# Patient Record
Sex: Female | Born: 1995 | Race: Black or African American | Hispanic: No | Marital: Single | Smoking: Current some day smoker
Health system: Southern US, Community
[De-identification: ages and names within clinical notes are randomized; demographics above are authoritative.]

## PROBLEM LIST (undated history)

## (undated) DIAGNOSIS — J45909 Unspecified asthma, uncomplicated: Secondary | ICD-10-CM

## (undated) HISTORY — PX: CHOLECYSTECTOMY: SHX55

---

## 2015-11-14 ENCOUNTER — Emergency Department (HOSPITAL_BASED_OUTPATIENT_CLINIC_OR_DEPARTMENT_OTHER)
Admission: EM | Admit: 2015-11-14 | Discharge: 2015-11-14 | Disposition: A | Discharge status: Home / Self Care | Payer: Medicaid Other | Attending: Emergency Medicine | Admitting: Emergency Medicine

## 2015-11-14 ENCOUNTER — Encounter (HOSPITAL_BASED_OUTPATIENT_CLINIC_OR_DEPARTMENT_OTHER): Payer: Self-pay | Admitting: *Deleted

## 2015-11-14 ENCOUNTER — Emergency Department (HOSPITAL_BASED_OUTPATIENT_CLINIC_OR_DEPARTMENT_OTHER): Payer: Medicaid Other

## 2015-11-14 DIAGNOSIS — F17200 Nicotine dependence, unspecified, uncomplicated: Secondary | ICD-10-CM | POA: Diagnosis not present

## 2015-11-14 DIAGNOSIS — J45909 Unspecified asthma, uncomplicated: Secondary | ICD-10-CM | POA: Insufficient documentation

## 2015-11-14 DIAGNOSIS — M545 Low back pain, unspecified: Secondary | ICD-10-CM

## 2015-11-14 HISTORY — DX: Unspecified asthma, uncomplicated: J45.909

## 2015-11-14 LAB — URINALYSIS, ROUTINE W REFLEX MICROSCOPIC
BILIRUBIN URINE: NEGATIVE
Glucose, UA: NEGATIVE mg/dL
Hgb urine dipstick: NEGATIVE
Ketones, ur: NEGATIVE mg/dL
Leukocytes, UA: NEGATIVE
NITRITE: NEGATIVE
PROTEIN: NEGATIVE mg/dL
SPECIFIC GRAVITY, URINE: 1.024 (ref 1.005–1.030)
pH: 6.5 (ref 5.0–8.0)

## 2015-11-14 LAB — PREGNANCY, URINE: PREG TEST UR: NEGATIVE

## 2015-11-14 MED ORDER — DIAZEPAM 5 MG PO TABS: 5.0000 mg | ORAL_TABLET | Freq: Two times a day (BID) | ORAL | 0 refills | Status: DC

## 2015-11-14 MED ORDER — IBUPROFEN 600 MG PO TABS: 600.0000 mg | ORAL_TABLET | Freq: Four times a day (QID) | ORAL | 0 refills | Status: DC | PRN

## 2015-11-14 NOTE — Discharge Instructions (Signed)
Take your medication as prescribed as needed for pain relief. I also recommend applying ice or heat to affected area for 15 minutes 3-4 times daily. Please follow up with a primary care provider from the Resource Guide provided below in one week if your symptoms have not improved. Please return to the Emergency Department if symptoms worsen or new onset of fever, numbness, tingling, saddle anesthesia, loss of bowel or bladder, weakness.

## 2015-11-14 NOTE — ED Provider Notes (Signed)
[facility] Provider Note   CSN: [id number] Arrival date & time: 11/14/15  1419  First Provider Contact:  First MD Initiated Contact with Patient 11/14/15 1620     By signing my name below, I, Rosario Adie, attest that this documentation has been prepared under the direction and in the presence of Melburn Hake, PA-C.  Electronically Signed: Rosario Adie, ED Scribe. 11/14/15. 4:35 PM.  History   Chief Complaint Chief Complaint  Patient presents with  . Back Pain   The history is provided by the patient. No language interpreter was used.   HPI Comments: Makayla Lewis is a 20 y.o. female who presents to the Emergency Department complaining of gradual onset, intermittent, throbbing lower middle and left-sided back pain that radiates into her left hip and thigh onset ~5 days ago, worsening this AM. She states that she has had associated tingling down due to left buttocks since the onset of her back pain. Pt reports that she was trying to help lift her grandmother up five days ago when she lost her balance causing her to fall onto a wooden table and strike the right side of her body. Denies LOC or head injury. She states that her pain initially was only mild after the incident, but worsened when she woke up this morning. 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 Pt reports that she has taken Tramadol, Naproxen, and Aleve with minimal relief of her pain. Her pain is worsened with bending, twisting, and ambulation. No hx of cancer, of IVDA. She has not seen a Chiropractor or Acupuncturist since the onset of her back pain, or otherwise. Denies nausea, vomiting, abdominal pain, diarrhea, constipation, bowel/bladder incontinence, vaginal bleeding, saddle paresthesias, weakness, or any other associated symptoms.  Past Medical History:  Diagnosis Date  . Asthma     There are no active problems to display for this patient.  Past Surgical History:  Procedure Laterality Date  . CHOLECYSTECTOMY     OB History    No data available     Home Medications    Prior to Admission medications   Medication Sig Start Date End Date Taking? Authorizing Provider  diazepam (VALIUM) 5 MG tablet Take 1 tablet (5 mg total) by mouth 2 (two) times daily. 11/14/15   Barrett Henle, PA-C  ibuprofen (ADVIL,MOTRIN) 600 MG tablet Take 1 tablet (600 mg total) by mouth every 6 (six) hours as needed. 11/14/15   Barrett Henle, PA-C   Family History History reviewed. No pertinent family history.  Social History Social History  Substance Use Topics  . Smoking status: Current Some Day Smoker  . Smokeless tobacco: Never Used  . Alcohol use No   Allergies   Review of patient's allergies indicates no known allergies.  Review of Systems Review of Systems  Gastrointestinal: Negative for abdominal pain, constipation, diarrhea, nausea and vomiting.  Genitourinary: Negative for vaginal bleeding.  Musculoskeletal: Positive for back pain (lower, left sided) and myalgias.  Neurological: Negative for syncope, weakness and numbness.       Positive for tingling (left leg).  All other systems reviewed and are negative.  Physical Exam Updated Vital Signs BP (!) 105/52 (BP Location: Right Arm)   Pulse 89   Temp 98.3 F (36.8 C) (Oral)   Resp 18   Ht 5\' 5"  (1.651 m)   Wt 96.6 kg   LMP  10/24/2015 (Approximate)   SpO2 99%   BMI 35.45 kg/m   Physical Exam  Constitutional: She is oriented to person, place, and time. She appears well-developed and well-nourished.  HENT:  Head: Normocephalic and atraumatic.  Eyes: Conjunctivae and EOM are normal. Right eye exhibits no discharge. Left eye exhibits no discharge. No scleral icterus.  Neck: Normal range of motion. Neck supple.  Cardiovascular: Normal rate, regular rhythm, normal heart sounds and intact distal  pulses.   No murmur heard. Pulmonary/Chest: Effort normal and breath sounds normal. No respiratory distress. She has no wheezes. She has no rales. She exhibits no tenderness.  Abdominal: Soft. Bowel sounds are normal. She exhibits no distension and no mass. There is no tenderness. There is no rebound and no guarding. No hernia.  Musculoskeletal: Normal range of motion. She exhibits no edema.  No midline C, T, or L tenderness. TTP over left lower thoracic and lumbar paraspinal muscles.  Full range of motion of neck and back. Full range of motion of bilateral upper and lower extremities, with 5/5 strength. Sensation intact. 2+ radial and PT pulses. Cap refill <2 seconds. Patient able to stand and ambulate without assistance. Negative straight leg raise bilaterally.   Neurological: She is alert and oriented to person, place, and time. She has normal strength. No sensory deficit. Gait normal.  Skin: Skin is warm and dry.  Psychiatric: She has a normal mood and affect. Her behavior is normal.  Nursing note and vitals reviewed.  ED Treatments / Results  DIAGNOSTIC STUDIES: Oxygen Saturation is 99% on RA, normal by my interpretation.   COORDINATION OF CARE: 4:35 PM-Discussed next steps with pt. Pt verbalized understanding and is agreeable with the plan.   Labs (all labs ordered are listed, but only abnormal results are displayed) Labs Reviewed  URINALYSIS, ROUTINE W REFLEX MICROSCOPIC (NOT AT [facility]) - Abnormal; Notable for the following:       Result Value   APPearance CLOUDY (*)    All other components within normal limits  PREGNANCY, URINE    EKG  EKG Interpretation None       Radiology Dg Lumbar Spine Complete  Result Date: 11/14/2015 CLINICAL DATA:  Fall onto coffee table five days ago. Low back pain radiating to left leg. Initial encounter. EXAM: LUMBAR SPINE - COMPLETE 4+ VIEW COMPARISON:  None. FINDINGS: There is no evidence of lumbar spine fracture. Alignment is normal.  Intervertebral disc spaces are maintained. No evidence of facet arthropathy or other bone lesions. Right upper quadrant surgical clips, consistent with prior cholecystectomy. IMPRESSION: Negative lumbar spine radiographs. Electronically Signed   By: Myles Rosenthal M.D.   On: 11/14/2015 16:08    Procedures Procedures (including critical care time)  Medications Ordered in ED Medications - No data to display   Initial Impression / Assessment and Plan / ED Course  I have reviewed the triage vital signs and the nursing notes.  Pertinent labs & imaging results that were available during my care of the patient were reviewed by me and considered in my medical decision making (see chart for details).  Clinical Course  Comment By Time  Pregnancy negative ordered lumbar XR while Pt is in waiting to expedite care Wynetta Emery, PA-C 08/12 1546    Patient presents with left-sided mid and lower back pain that started 1 week ago after she fell while trying to treat her grandmother. Denies head injury or LOC. Pain is worse with movement, bending or lifting. No back pain red flags. VSS. Exam  revealed tenderness over left lower thoracic and lumbar paraspinal muscles, no midline spinal tenderness, no neuro deficits. Remaining exam unremarkable. Lumbar spine x-ray negative. Pregnancy negative. UA unremarkable. I suspect patient's symptoms are likely due to muscle strain associated with recent injury. Plan to discharge patient home with NSAIDs, muscle relaxant and symptomatic treatment. Patient given information to follow up with PCP as needed. Discussed return precautions with patient.  Final Clinical Impressions(s) / ED Diagnoses   Final diagnoses:  Left-sided low back pain without sciatica    New Prescriptions New Prescriptions   DIAZEPAM (VALIUM) 5 MG TABLET    Take 1 tablet (5 mg total) by mouth 2 (two) times daily.   IBUPROFEN (ADVIL,MOTRIN) 600 MG TABLET    Take 1 tablet (600 mg total) by mouth  every 6 (six) hours as needed.   I personally performed the services described in this documentation, which was scribed in my presence. The recorded information has been reviewed and is accurate.     Satira Sarkicole Elizabeth TexhomaNadeau, [place] JerseyPA-C 11/14/15 1647    Rolland PorterMark James, MD 11/17/15 1137

## 2015-11-14 NOTE — ED Triage Notes (Signed)
Pt states she fell approx 1 week ago and has had pain in her lower back since. Pain radiates to left side. Has tried Naproxen and Tramadol without relief. Denies other s/s.

## 2015-11-14 NOTE — ED Notes (Signed)
Pt given d/c instructions as per chart. Rx x 2. Verbalizes understanding. No questions. 

## 2017-08-11 ENCOUNTER — Encounter (HOSPITAL_BASED_OUTPATIENT_CLINIC_OR_DEPARTMENT_OTHER): Payer: Self-pay | Admitting: Emergency Medicine

## 2017-08-11 ENCOUNTER — Other Ambulatory Visit: Payer: Self-pay

## 2017-08-11 ENCOUNTER — Emergency Department (HOSPITAL_BASED_OUTPATIENT_CLINIC_OR_DEPARTMENT_OTHER)
Admission: EM | Admit: 2017-08-11 | Discharge: 2017-08-11 | Disposition: A | Discharge status: Home / Self Care | Payer: Self-pay | Attending: Emergency Medicine | Admitting: Emergency Medicine

## 2017-08-11 DIAGNOSIS — R51 Headache: Secondary | ICD-10-CM | POA: Insufficient documentation

## 2017-08-11 DIAGNOSIS — F17200 Nicotine dependence, unspecified, uncomplicated: Secondary | ICD-10-CM | POA: Insufficient documentation

## 2017-08-11 DIAGNOSIS — J45909 Unspecified asthma, uncomplicated: Secondary | ICD-10-CM | POA: Insufficient documentation

## 2017-08-11 DIAGNOSIS — R1110 Vomiting, unspecified: Secondary | ICD-10-CM | POA: Insufficient documentation

## 2017-08-11 DIAGNOSIS — H53149 Visual discomfort, unspecified: Secondary | ICD-10-CM | POA: Insufficient documentation

## 2017-08-11 DIAGNOSIS — R519 Headache, unspecified: Secondary | ICD-10-CM

## 2017-08-11 LAB — PREGNANCY, URINE: Preg Test, Ur: NEGATIVE

## 2017-08-11 MED ORDER — DIPHENHYDRAMINE HCL 50 MG/ML IJ SOLN
25.0000 mg | Freq: Once | INTRAMUSCULAR | Status: AC
  Administered 2017-08-11: 25 mg via INTRAVENOUS
  Filled 2017-08-11: qty 1

## 2017-08-11 MED ORDER — SODIUM CHLORIDE 0.9 % IV BOLUS
500.0000 mL | Freq: Once | INTRAVENOUS | Status: AC
  Administered 2017-08-11: 500 mL via INTRAVENOUS

## 2017-08-11 MED ORDER — METOCLOPRAMIDE HCL 5 MG/ML IJ SOLN
10.0000 mg | Freq: Once | INTRAMUSCULAR | Status: AC
  Administered 2017-08-11: 10 mg via INTRAVENOUS
  Filled 2017-08-11: qty 2

## 2017-08-11 NOTE — ED Notes (Signed)
Pt understood dc material. NAD noted. 

## 2017-08-11 NOTE — ED Provider Notes (Signed)
[facility] EMERGENCY DEPARTMENT Provider Note   CSN: [id number] Arrival date & time: 08/11/17  2048     History   Chief Complaint Chief Complaint  Patient presents with  . Headache    HPI Makayla Lewis is a 22 y.o. female.  Patient presents with complaint of headache over the past 3 days with episodes of vomiting.  She has associated light sensitivity.  Patient states that the pain is in the back of her head and moves over the top of her head.  Symptoms started gradually and were on and off however symptoms have become constant and have worsened gradually.  Patient denies any fevers or confusion.  She has no pain with movement of her neck.  No head injuries reported.  No sinus infection symptoms or toothaches. No vision loss or vision changes.  She is able to walk without any difficulty.   She denies any chest pain or abdominal pain.  She has not had any previous diagnosis of migraines or chronic headache.     Past Medical History:  Diagnosis Date  . Asthma     There are no active problems to display for this patient.   Past Surgical History:  Procedure Laterality Date  . CHOLECYSTECTOMY       OB History   None      Home Medications    Prior to Admission medications   Medication Sig Start Date End Date Taking? Authorizing Provider  albuterol (PROVENTIL HFA;VENTOLIN HFA) 108 (90 Base) MCG/ACT inhaler Inhale 2 puffs into the lungs every 6 (six) hours as needed for wheezing or shortness of breath.   Yes [provider]    Family History No family history on file.  Social History Social History   Tobacco Use  . Smoking status: Current Some Day Smoker  . Smokeless tobacco: Never Used  Substance Use Topics  . Alcohol use: No  . Drug use: No     Allergies   Patient has no known allergies.   Review of Systems Review of Systems  Constitutional: Negative for fever.  HENT: Negative for congestion, dental problem, rhinorrhea and sinus  pressure.   Eyes: Positive for photophobia. Negative for discharge, redness and visual disturbance.  Respiratory: Negative for shortness of breath.   Cardiovascular: Negative for chest pain.  Gastrointestinal: Positive for nausea and vomiting.  Musculoskeletal: Negative for gait problem, neck pain and neck stiffness.  Skin: Negative for rash.  Neurological: Positive for headaches. Negative for syncope, speech difficulty, weakness, light-headedness and numbness.  Psychiatric/Behavioral: Negative for confusion.     Physical Exam Updated Vital Signs BP 125/89 (BP Location: Left Arm)   Pulse 81   Temp 98.4 F (36.9 C) (Oral)   Resp 18   Ht  (1.651 m)   Wt 96.2 kg (212 lb)   SpO2 100%   BMI 35.28 kg/m   Physical Exam  Constitutional: She is oriented to person, place, and time. She appears well-developed and well-nourished.  HENT:  Head: Normocephalic and atraumatic.  Right Ear: Tympanic membrane, external ear and ear canal normal.  Left Ear: Tympanic membrane, external ear and ear canal normal.  Nose: Nose normal.  Mouth/Throat: Uvula is midline, oropharynx is clear and moist and mucous membranes are normal.  No sinus pressure or grossly abnormal dentition.  Eyes: Pupils are equal, round, and reactive to light. Conjunctivae, EOM and lids are normal. Right eye exhibits no nystagmus. Left eye exhibits no nystagmus.  Neck: Normal range of motion. Neck supple.  No signs of meningismus.  Cardiovascular: Normal rate and regular rhythm.  Pulmonary/Chest: Effort normal and breath sounds normal.  Abdominal: Soft. There is no tenderness.  Musculoskeletal:       Cervical back: She exhibits normal range of motion, no tenderness and no bony tenderness.  Neurological: She is alert and oriented to person, place, and time. She has normal strength and normal reflexes. No cranial nerve deficit or sensory deficit. She displays a negative Romberg sign. Coordination and gait normal. GCS eye  subscore is 4. GCS verbal subscore is 5. GCS motor subscore is 6.  Skin: Skin is warm and dry.  Psychiatric: She has a normal mood and affect.  Nursing note and vitals reviewed.    ED Treatments / Results  Labs (all labs ordered are listed, but only abnormal results are displayed) Labs Reviewed  PREGNANCY, URINE    EKG None  Radiology No results found.  Procedures Procedures (including critical care time)  Medications Ordered in ED Medications  metoCLOPramide (REGLAN) injection 10 mg (10 mg Intravenous Given 08/11/17 2121)  diphenhydrAMINE (BENADRYL) injection 25 mg (25 mg Intravenous Given 08/11/17 2121)  sodium chloride 0.9 % bolus 500 mL (0 mLs Intravenous Stopped 08/11/17 2205)     Initial Impression / Assessment and Plan / ED Course  I have reviewed the triage vital signs and the nursing notes.  Pertinent labs & imaging results that were available during my care of the patient were reviewed by me and considered in my medical decision making (see chart for details).     Patient seen and examined.  Patient with normal neurological exam.  No red flag symptoms.  No signs of meningitis.  No reported head injury.  No anticoagulation.  No indications for head CT at this time.  Will reassess after migraine cocktail.  Vital signs reviewed and are as follows: BP 125/89 (BP Location: Left Arm)   Pulse 81   Temp 98.4 F (36.9 C) (Oral)   Resp 18   Ht  (1.651 m)   Wt 96.2 kg (212 lb)   SpO2 100%   BMI 35.28 kg/m   11:00 PM resolution of headache with migraine cocktail.  Feel comfortable discharged home at this time.  Encourage patient to rest tonight, hydrate over the weekend.  Patient counseled to return if they have weakness in their arms or legs, slurred speech, trouble walking or talking, confusion, trouble with their balance, or if they have any other concerns. Patient verbalizes understanding and agrees with plan.     Final Clinical Impressions(s) / ED  Diagnoses   Final diagnoses:  Acute nonintractable headache, unspecified headache type   Patient without high-risk features of headache including: sudden onset/thunderclap HA, altered mental status, accompanying seizure, headache with exertion, age > 19, history of immunocompromise, neck or shoulder pain, fever, use of anticoagulation, family history of spontaneous SAH, concomitant drug use, toxic exposure.   Patient has a normal complete neurological exam, normal vital signs, normal level of consciousness, no signs of meningismus, is well-appearing/non-toxic appearing, no signs of trauma.   Imaging with CT/MRI not indicated given history and physical exam findings.   No dangerous or life-threatening conditions suspected or identified by history, physical exam, and by work-up. No indications for hospitalization identified.    ED Discharge Orders    None       Renne Crigler, PA-C 08/11/17 2300    Melene Plan, DO 08/11/17 2327

## 2017-08-11 NOTE — ED Triage Notes (Signed)
Pt c/o headache x 3 days with vomiting.

## 2017-08-11 NOTE — ED Notes (Signed)
Pt ambulated to restroom without assistance. NAD noted. Steady gait  

## 2017-08-11 NOTE — ED Notes (Signed)
ED Provider at bedside. 

## 2017-08-11 NOTE — ED Notes (Signed)
Pt was given saltine crackers and ginger ale for PO challenge

## 2017-08-11 NOTE — Discharge Instructions (Signed)
Please read and follow all provided instructions.  Your diagnoses today include:  1. Acute nonintractable headache, unspecified headache type     Tests performed today include:  Vital signs. See below for your results today.   Medications:  In the Emergency Department you received:  Reglan - antinausea/headache medication  Benadryl - antihistamine to counteract potential side effects of reglan  Take any prescribed medications only as directed.  Additional information:  Follow any educational materials contained in this packet.  You are having a headache. No specific cause was found today for your headache. It may have been a migraine or other cause of headache. Stress, anxiety, fatigue, and depression are common triggers for headaches.   Your headache today does not appear to be life-threatening or require hospitalization, but often the exact cause of headaches is not determined in the emergency department. Therefore, follow-up with your doctor is very important to find out what may have caused your headache and whether or not you need any further diagnostic testing or treatment.   Sometimes headaches can appear benign (not harmful), but then more serious symptoms can develop which should prompt an immediate re-evaluation by your doctor or the emergency department.  BE VERY CAREFUL not to take multiple medicines containing Tylenol (also called acetaminophen). Doing so can lead to an overdose which can damage your liver and cause liver failure and possibly death.   Follow-up instructions: Please follow-up with your primary care provider in the next 3 days for further evaluation of your symptoms.   Return instructions:   Please return to the Emergency Department if you experience worsening symptoms.  Return if the medications do not resolve your headache, if it recurs, or if you have multiple episodes of vomiting or cannot keep down fluids.  Return if you have a change from the  usual headache.  RETURN IMMEDIATELY IF you:  Develop a sudden, severe headache  Develop confusion or become poorly responsive or faint  Develop a fever above 100.56F or problem breathing  Have a change in speech, vision, swallowing, or understanding  Develop new weakness, numbness, tingling, incoordination in your arms or legs  Have a seizure  Please return if you have any other emergent concerns.  Additional Information:  Your vital signs today were: BP 125/89 (BP Location: Left Arm)    Pulse 81    Temp 98.4 F (36.9 C) (Oral)    Resp 18    Ht  (1.651 m)    Wt 96.2 kg (212 lb)    SpO2 100%    BMI 35.28 kg/m  If your blood pressure (BP) was elevated above 135/85 this visit, please have this repeated by your doctor within one month. --------------

## 2019-05-07 ENCOUNTER — Other Ambulatory Visit: Payer: Self-pay

## 2019-05-07 ENCOUNTER — Encounter (HOSPITAL_BASED_OUTPATIENT_CLINIC_OR_DEPARTMENT_OTHER): Payer: Self-pay

## 2019-05-07 ENCOUNTER — Emergency Department (HOSPITAL_BASED_OUTPATIENT_CLINIC_OR_DEPARTMENT_OTHER)
Admission: EM | Admit: 2019-05-07 | Discharge: 2019-05-07 | Disposition: A | Discharge status: Home / Self Care | Payer: Self-pay | Attending: Emergency Medicine | Admitting: Emergency Medicine

## 2019-05-07 DIAGNOSIS — R6883 Chills (without fever): Secondary | ICD-10-CM | POA: Insufficient documentation

## 2019-05-07 DIAGNOSIS — Z20822 Contact with and (suspected) exposure to covid-19: Secondary | ICD-10-CM | POA: Insufficient documentation

## 2019-05-07 DIAGNOSIS — E876 Hypokalemia: Secondary | ICD-10-CM | POA: Insufficient documentation

## 2019-05-07 DIAGNOSIS — M7918 Myalgia, other site: Secondary | ICD-10-CM | POA: Insufficient documentation

## 2019-05-07 DIAGNOSIS — R519 Headache, unspecified: Secondary | ICD-10-CM | POA: Insufficient documentation

## 2019-05-07 DIAGNOSIS — N61 Mastitis without abscess: Secondary | ICD-10-CM | POA: Insufficient documentation

## 2019-05-07 LAB — BASIC METABOLIC PANEL
Anion gap: 7 (ref 5–15)
BUN: 7 mg/dL (ref 6–20)
CO2: 24 mmol/L (ref 22–32)
Calcium: 8.9 mg/dL (ref 8.9–10.3)
Chloride: 102 mmol/L (ref 98–111)
Creatinine, Ser: 0.79 mg/dL (ref 0.44–1.00)
GFR calc Af Amer: >60 mL/min (ref >60)
GFR calc non Af Amer: >60 mL/min (ref >60)
Glucose, Bld: 82 mg/dL (ref 70–99)
Potassium: 3.2 mmol/L — ABNORMAL LOW (ref 3.5–5.1)
Sodium: 133 mmol/L — ABNORMAL LOW (ref 135–145)

## 2019-05-07 LAB — CBC WITH DIFFERENTIAL/PLATELET
Abs Immature Granulocytes: 0.06 10*3/uL (ref 0.00–0.07)
Basophils Absolute: 0.1 10*3/uL (ref 0.0–0.1)
Basophils Relative: 0 %
Eosinophils Absolute: 0 10*3/uL (ref 0.0–0.5)
Eosinophils Relative: 0 %
HCT: 42.6 % (ref 36.0–46.0)
Hemoglobin: 14 g/dL (ref 12.0–15.0)
Immature Granulocytes: 1 %
Lymphocytes Relative: 22 %
Lymphs Abs: 2.9 10*3/uL (ref 0.7–4.0)
MCH: 31.2 pg (ref 26.0–34.0)
MCHC: 32.9 g/dL (ref 30.0–36.0)
MCV: 94.9 fL (ref 80.0–100.0)
Monocytes Absolute: 1 10*3/uL (ref 0.1–1.0)
Monocytes Relative: 8 %
Neutro Abs: 8.9 10*3/uL — ABNORMAL HIGH (ref 1.7–7.7)
Neutrophils Relative %: 69 %
Platelets: 403 10*3/uL — ABNORMAL HIGH (ref 150–400)
RBC: 4.49 MIL/uL (ref 3.87–5.11)
RDW: 12.2 % (ref 11.5–15.5)
WBC: 13 10*3/uL — ABNORMAL HIGH (ref 4.0–10.5)
nRBC: 0 % (ref 0.0–0.2)

## 2019-05-07 MED ORDER — POTASSIUM CHLORIDE CRYS ER 20 MEQ PO TBCR
40.0000 meq | EXTENDED_RELEASE_TABLET | Freq: Once | ORAL | Status: AC
  Administered 2019-05-07: 19:00:00 40 meq via ORAL
  Filled 2019-05-07: qty 2

## 2019-05-07 MED ORDER — ACETAMINOPHEN 325 MG PO TABS
650.0000 mg | ORAL_TABLET | Freq: Once | ORAL | Status: AC
  Administered 2019-05-07: 18:00:00 650 mg via ORAL
  Filled 2019-05-07: qty 2

## 2019-05-07 MED ORDER — CEPHALEXIN 500 MG PO CAPS: 500.0000 mg | ORAL_CAPSULE | Freq: Four times a day (QID) | ORAL | 0 refills | Status: AC

## 2019-05-07 MED ORDER — POTASSIUM CHLORIDE ER 10 MEQ PO TBCR: 10.0000 meq | EXTENDED_RELEASE_TABLET | Freq: Every day | ORAL | 0 refills | Status: AC

## 2019-05-07 MED ORDER — CEPHALEXIN 250 MG PO CAPS
500.0000 mg | ORAL_CAPSULE | Freq: Once | ORAL | Status: AC
  Administered 2019-05-07: 500 mg via ORAL
  Filled 2019-05-07: qty 2

## 2019-05-07 NOTE — Discharge Instructions (Addendum)
Please pick up antibiotics and take as prescribed for your skin infection. You should see improvement in your symptoms after 48 hours. If no improvement or worsening of symptoms please return to the ED for further evaluation. Otherwise [facility].   Take Ibuprofen and Tylenol as needed for pain. You can also apply and ice pack to your breast to reduce discomfort.   Your potassium was mildly decreased today - we have given you some potassium in the ED and I have discharged you home with some. Please have your potassium level rechecked in 1-2 weeks.   We have also swabbed you for COVID today - please stay home and self isolate until you receive your results. We will call you if you are positive. You can also log into your my chart to check your results. If positive you will need to stay home for 14 days starting today (cleared: 05/22/2019).

## 2019-05-07 NOTE — ED Triage Notes (Addendum)
Pt c/o pain, swelling to left breast, HA, fatigue-pt has redness noted to left breast-nipple piercing in place-states there since Sept 2020-NAD-steady gait-pt also requests to be tested for covid

## 2019-05-07 NOTE — ED Notes (Signed)
ED Provider at bedside. 

## 2019-05-07 NOTE — ED Provider Notes (Signed)
[facility] EMERGENCY DEPARTMENT Provider Note   CSN: [id number] Arrival date & time: 05/07/19  1703     History Chief Complaint  Patient presents with  . Breast Pain    Makayla Lewis is a 24 y.o. female with PMHx asthma who presents to the ED today complaining of gradual onset, constant, achy pain to L breast that began last night. Pt also complains of redness and swelling to the breast. No nipple discharge. Does report she had her nipples pierced in August/September 2020 and has not had any issues since then. Pt does not breast feed currently; she has no children. Denies Fhx of breast cancer.   Pt also complains of a headache, chills, and body aches that started overnight. She is concerned she could have covid and is request testing. No recent COVID 19 positive exposure. Pt states she has not seen anyone that she does not see on a daily basis however she states she sees her friend who does not live in her household on a daily basis. Pt denies fevers, cough, shortness of breath, diarrhea, sore throat, ear pain, or any other associated symptoms.   The history is provided by the patient.       Past Medical History:  Diagnosis Date  . Asthma     There are no problems to display for this patient.   Past Surgical History:  Procedure Laterality Date  . CHOLECYSTECTOMY       OB History   No obstetric history on file.     No family history on file.  Social History   Tobacco Use  . Smoking status: Current Some Day Smoker  . Smokeless tobacco: Never Used  Substance Use Topics  . Alcohol use: No  . Drug use: No    Home Medications Prior to Admission medications   Medication Sig Start Date End Date Taking? Authorizing Provider  albuterol (PROVENTIL HFA;VENTOLIN HFA) 108 (90 Base) MCG/ACT inhaler Inhale 2 puffs into the lungs every 6 (six) hours as needed for wheezing or shortness of breath.    [provider]  cephALEXin (KEFLEX) 500 MG capsule Take 1  capsule (500 mg total) by mouth 4 (four) times daily for 7 days. 05/07/19 05/14/19  Tanda Rockers, PA-C  potassium chloride (KLOR-CON) 10 MEQ tablet Take 1 tablet (10 mEq total) by mouth daily for 5 days. 05/07/19 05/12/19  Tanda Rockers, PA-C    Allergies    Patient has no known allergies.  Review of Systems   Review of Systems  Constitutional: Positive for chills and fatigue. Negative for fever.  HENT: Negative for congestion and sore throat.   Respiratory: Negative for cough and shortness of breath.   Cardiovascular: Negative for chest pain.  Gastrointestinal: Negative for abdominal pain, nausea and vomiting.  Musculoskeletal: Positive for myalgias. Negative for neck pain and neck stiffness.  Skin: Positive for color change (breast pain, redness, swelling).  Neurological: Positive for headaches.  All other systems reviewed and are negative.   Physical Exam Updated Vital Signs BP (!) 118/95 (BP Location: Right Arm)   Pulse (!) 112   Temp 99.3 F (37.4 C) (Oral)   Resp 15   Ht 5\' 5"  (1.651 m)   Wt 95.3 kg   LMP 05/01/2019   SpO2 100%   BMI 34.95 kg/m   Physical Exam Vitals and nursing note reviewed.  Constitutional:      Appearance: She is not ill-appearing or diaphoretic.  HENT:     Head: Normocephalic and atraumatic.  Eyes:     Conjunctiva/sclera: Conjunctivae normal.  Cardiovascular:     Rate and Rhythm: Regular rhythm. Tachycardia present.  Pulmonary:     Effort: Pulmonary effort is normal.     Breath sounds: Normal breath sounds. No wheezing, rhonchi or rales.  Chest:     Breasts:        Right: Normal.        Left: Swelling, skin change and tenderness present. No bleeding, inverted nipple, mass or nipple discharge.     Comments: Erythema, edema, and increased warmth diffusely to L breast with TTP. No active nipple discharge. No areas of fluctuance or induration to suggest abscess  Abdominal:     Palpations: Abdomen is soft.     Tenderness: There is no abdominal  tenderness. There is no guarding or rebound.  Musculoskeletal:     Cervical back: Neck supple.  Skin:    General: Skin is warm and dry.  Neurological:     Mental Status: She is alert.     ED Results / Procedures / Treatments   Labs (all labs ordered are listed, but only abnormal results are displayed) Labs Reviewed  BASIC METABOLIC PANEL - Abnormal; Notable for the following components:      Result Value   Sodium 133 (*)    Potassium 3.2 (*)    All other components within normal limits  CBC WITH DIFFERENTIAL/PLATELET - Abnormal; Notable for the following components:   WBC 13.0 (*)    Platelets 403 (*)    Neutro Abs 8.9 (*)    All other components within normal limits  NOVEL CORONAVIRUS, NAA (HOSP ORDER, SEND-OUT TO REF LAB; TAT 18-24 HRS)    EKG None  Radiology No results found.  Procedures Procedures (including critical care time)  Medications Ordered in ED Medications  acetaminophen (TYLENOL) tablet 650 mg (650 mg Oral Given 05/07/19 1803)  potassium chloride SA (KLOR-CON) CR tablet 40 mEq (40 mEq Oral Given 05/07/19 1903)  cephALEXin (KEFLEX) capsule 500 mg (500 mg Oral Given 05/07/19 1903)    ED Course  I have reviewed the triage vital signs and the nursing notes.  Pertinent labs & imaging results that were available during my care of the patient were reviewed by me and considered in my medical decision making (see chart for details).  24 year old female who presents the ED today complaining of redness, swelling, pain to her left breast that began overnight.  She is also complaining of a headache, body aches, chills.  She is unsure if she could have Covid is requesting testing.  On arrival to the ED patient's temp 99.3 and tachycardic in the 110s.  She is satting 100% on room air and able to speak in full sentences without difficulty.  On exam she does have what appears to be cellulitis to her left breast.  There is no discernible fluctuance or induration to suggest  abscess at this time.  No nipple discharge.  No concern for inflammatory breast changes at this time.  Patient denies family history of breast cancer and given her young age it would be atypical.  Will treat with antibiotics outpatient.  Will obtain screening labs at this time and swab for Covid with send out test.  Patient will also be given information to the breast center for further evaluation if no improvement with antibiotics.   CBC with leukocytosis of 13,000 consistent with suspected cellulitis today.  BMP with potassium 3.2; will replete in the ED and discharge home with oral potassium.  On repeat eval pts HR has decreased to the 90's with tylenol. Feel she is stable for discharge at this time. Will treat with keflex outpatient; will give first dose in the ED tonight.   Pt advised to follow up with breast and imaging center if no improvement in symptoms. She can also follow up here in 48 hours if no improvement or worsening symptoms. Pt advised to self isolate due to covid testing. She is in agreement with plan and stable for discharge home.   This note was prepared using Dragon voice recognition software and may include unintentional dictation errors due to the inherent limitations of voice recognition software.     MDM Rules/Calculators/A&P                       Final Clinical Impression(s) / ED Diagnoses Final diagnoses:  Cellulitis of left breast  Hypokalemia  Person under investigation for COVID-19    Rx / DC Orders ED Discharge Orders         Ordered    cephALEXin (KEFLEX) 500 MG capsule  4 times daily     05/07/19 1913    potassium chloride (KLOR-CON) 10 MEQ tablet  Daily     05/07/19 1913           Discharge Instructions     Please pick up antibiotics and take as prescribed for your skin infection. You should see improvement in your symptoms after 48 hours. If no improvement or worsening of symptoms please return to the ED for further evaluation. Otherwise  please follow up with the [facility].   Take Ibuprofen and Tylenol as needed for pain. You can also apply and ice pack to your breast to reduce discomfort.   Your potassium was mildly decreased today - we have given you some potassium in the ED and I have discharged you home with some. Please have your potassium level rechecked in 1-2 weeks.   We have also swabbed you for COVID today - please stay home and self isolate until you receive your results. We will call you if you are positive. You can also log into your my chart to check your results. If positive you will need to stay home for 14 days starting today (cleared: 05/22/2019).        Eustaquio Maize, PA-C 05/07/19 1917    Fredia Sorrow, MD 05/12/19 [id number]

## 2019-05-08 LAB — NOVEL CORONAVIRUS, NAA (HOSP ORDER, SEND-OUT TO REF LAB; TAT 18-24 HRS): SARS-CoV-2, NAA: NOT DETECTED

## 2020-04-30 ENCOUNTER — Emergency Department (HOSPITAL_BASED_OUTPATIENT_CLINIC_OR_DEPARTMENT_OTHER)
Admission: EM | Admit: 2020-04-30 | Discharge: 2020-04-30 | Disposition: A | Discharge status: Home / Self Care | Payer: Medicaid Other | Attending: Emergency Medicine | Admitting: Emergency Medicine

## 2020-04-30 ENCOUNTER — Other Ambulatory Visit: Payer: Self-pay

## 2020-04-30 ENCOUNTER — Encounter (HOSPITAL_BASED_OUTPATIENT_CLINIC_OR_DEPARTMENT_OTHER): Payer: Self-pay

## 2020-04-30 DIAGNOSIS — Z20822 Contact with and (suspected) exposure to covid-19: Secondary | ICD-10-CM | POA: Insufficient documentation

## 2020-04-30 DIAGNOSIS — J069 Acute upper respiratory infection, unspecified: Secondary | ICD-10-CM | POA: Insufficient documentation

## 2020-04-30 DIAGNOSIS — F17200 Nicotine dependence, unspecified, uncomplicated: Secondary | ICD-10-CM | POA: Insufficient documentation

## 2020-04-30 DIAGNOSIS — J45909 Unspecified asthma, uncomplicated: Secondary | ICD-10-CM | POA: Insufficient documentation

## 2020-04-30 LAB — RAPID INFLUENZA A&B ANTIGENS
Influenza A (ARMC): NEGATIVE
Influenza B (ARMC): NEGATIVE

## 2020-04-30 NOTE — Discharge Instructions (Addendum)
Your flu test is negative.  Covid is pending.  Tylenol for fever and body aches.  Return if any problems.

## 2020-04-30 NOTE — ED Provider Notes (Signed)
[facility] EMERGENCY DEPARTMENT Provider Note   CSN: [id number] Arrival date & time: 04/30/20  1246     History Chief Complaint  Patient presents with  . Cough    Makayla Lewis is a 25 y.o. female.  The history is provided by the patient. No language interpreter was used.  Cough Cough characteristics:  Non-productive Sputum characteristics:  Nondescript Severity:  Mild Onset quality:  Gradual Timing:  Constant Progression:  Worsening Smoker: no   Relieved by:  Nothing Worsened by:  Nothing Ineffective treatments:  None tried Associated symptoms: myalgias   Associated symptoms: no shortness of breath    Pt reports she was exposed to flu.  Pt reports she had covid 3 months ago.  Pt has not had immunizations     Past Medical History:  Diagnosis Date  . Asthma     There are no problems to display for this patient.   Past Surgical History:  Procedure Laterality Date  . CHOLECYSTECTOMY       OB History   No obstetric history on file.     No family history on file.  Social History   Tobacco Use  . Smoking status: Current Some Day Smoker  . Smokeless tobacco: Never Used  Vaping Use  . Vaping Use: Never used  Substance Use Topics  . Alcohol use: No  . Drug use: No    Home Medications Prior to Admission medications   Medication Sig Start Date End Date Taking? Authorizing Provider  albuterol (PROVENTIL HFA;VENTOLIN HFA) 108 (90 Base) MCG/ACT inhaler Inhale 2 puffs into the lungs every 6 (six) hours as needed for wheezing or shortness of breath.    [provider]  potassium chloride (KLOR-CON) 10 MEQ tablet Take 1 tablet (10 mEq total) by mouth daily for 5 days. 05/07/19 05/12/19  Tanda Rockers, PA-C    Allergies    Patient has no known allergies.  Review of Systems   Review of Systems  Respiratory: Positive for cough. Negative for shortness of breath.   Musculoskeletal: Positive for myalgias.  All other systems reviewed and are  negative.   Physical Exam Updated Vital Signs BP 120/81 (BP Location: Right Arm)   Pulse 99   Temp 98.9 F (37.2 C) (Oral)   Resp 18   Ht 5\' 5"  (1.651 m)   Wt 98.9 kg   LMP 04/27/2020   SpO2 95%   BMI 36.28 kg/m   Physical Exam Vitals and nursing note reviewed.  Constitutional:      Appearance: She is well-developed and well-nourished.  HENT:     Head: Normocephalic.     Nose: Nose normal.     Mouth/Throat:     Mouth: Mucous membranes are moist.  Eyes:     Extraocular Movements: EOM normal.  Cardiovascular:     Rate and Rhythm: Normal rate.     Pulses: Normal pulses.  Pulmonary:     Effort: Pulmonary effort is normal.  Abdominal:     General: There is no distension.  Musculoskeletal:        General: Normal range of motion.     Cervical back: Normal range of motion.  Skin:    General: Skin is warm.  Neurological:     Mental Status: She is alert and oriented to person, place, and time.  Psychiatric:        Mood and Affect: Mood and affect and mood normal.     ED Results / Procedures / Treatments   Labs (all  labs ordered are listed, but only abnormal results are displayed) Labs Reviewed  RAPID INFLUENZA A&B ANTIGENS  SARS CORONAVIRUS 2 (TAT 6-24 HRS)    EKG None  Radiology No results found.  Procedures Procedures   Medications Ordered in ED Medications - No data to display  ED Course  I have reviewed the triage vital signs and the nursing notes.  Pertinent labs & imaging results that were available during my care of the patient were reviewed by me and considered in my medical decision making (see chart for details).    MDM Rules/Calculators/A&P                           Final Clinical Impression(s) / ED Diagnoses Final diagnoses:  Viral URI with cough    Rx / DC Orders ED Discharge Orders    None    An After Visit Summary was printed and given to the patient.    Elson Areas, [place] 04/30/20 1550    Alvira Monday,  MD 05/03/20 1057

## 2020-04-30 NOTE — ED Triage Notes (Signed)
Pt c/o flu like sx started yesterday-NAD-steady gait 

## 2020-05-01 LAB — SARS CORONAVIRUS 2 (TAT 6-24 HRS): SARS Coronavirus 2: NEGATIVE

## 2020-11-19 ENCOUNTER — Emergency Department (HOSPITAL_BASED_OUTPATIENT_CLINIC_OR_DEPARTMENT_OTHER): Payer: Self-pay

## 2020-11-19 ENCOUNTER — Emergency Department (HOSPITAL_BASED_OUTPATIENT_CLINIC_OR_DEPARTMENT_OTHER)
Admission: EM | Admit: 2020-11-19 | Discharge: 2020-11-19 | Disposition: A | Discharge status: Home / Self Care | Payer: Self-pay | Attending: Emergency Medicine | Admitting: Emergency Medicine

## 2020-11-19 ENCOUNTER — Encounter (HOSPITAL_BASED_OUTPATIENT_CLINIC_OR_DEPARTMENT_OTHER): Payer: Self-pay | Admitting: *Deleted

## 2020-11-19 ENCOUNTER — Other Ambulatory Visit: Payer: Self-pay

## 2020-11-19 DIAGNOSIS — B9689 Other specified bacterial agents as the cause of diseases classified elsewhere: Secondary | ICD-10-CM

## 2020-11-19 DIAGNOSIS — N939 Abnormal uterine and vaginal bleeding, unspecified: Secondary | ICD-10-CM

## 2020-11-19 DIAGNOSIS — J45909 Unspecified asthma, uncomplicated: Secondary | ICD-10-CM | POA: Insufficient documentation

## 2020-11-19 DIAGNOSIS — N760 Acute vaginitis: Secondary | ICD-10-CM | POA: Insufficient documentation

## 2020-11-19 DIAGNOSIS — F17200 Nicotine dependence, unspecified, uncomplicated: Secondary | ICD-10-CM | POA: Insufficient documentation

## 2020-11-19 DIAGNOSIS — R102 Pelvic and perineal pain: Secondary | ICD-10-CM

## 2020-11-19 LAB — CBC WITH DIFFERENTIAL/PLATELET
Abs Immature Granulocytes: 0.04 10*3/uL (ref 0.00–0.07)
Basophils Absolute: 0 10*3/uL (ref 0.0–0.1)
Basophils Relative: 1 %
Eosinophils Absolute: 0.2 10*3/uL (ref 0.0–0.5)
Eosinophils Relative: 2 %
HCT: 36.1 % (ref 36.0–46.0)
Hemoglobin: 12.4 g/dL (ref 12.0–15.0)
Immature Granulocytes: 1 %
Lymphocytes Relative: 43 %
Lymphs Abs: 3.5 10*3/uL (ref 0.7–4.0)
MCH: 31.2 pg (ref 26.0–34.0)
MCHC: 34.3 g/dL (ref 30.0–36.0)
MCV: 90.7 fL (ref 80.0–100.0)
Monocytes Absolute: 0.6 10*3/uL (ref 0.1–1.0)
Monocytes Relative: 8 %
Neutro Abs: 3.6 10*3/uL (ref 1.7–7.7)
Neutrophils Relative %: 45 %
Platelets: 341 10*3/uL (ref 150–400)
RBC: 3.98 MIL/uL (ref 3.87–5.11)
RDW: 13 % (ref 11.5–15.5)
Smear Review: ADEQUATE
WBC: 7.9 10*3/uL (ref 4.0–10.5)
nRBC: 0 % (ref 0.0–0.2)

## 2020-11-19 LAB — BASIC METABOLIC PANEL
Anion gap: 7 (ref 5–15)
BUN: 10 mg/dL (ref 6–20)
CO2: 24 mmol/L (ref 22–32)
Calcium: 8.8 mg/dL — ABNORMAL LOW (ref 8.9–10.3)
Chloride: 103 mmol/L (ref 98–111)
Creatinine, Ser: 0.7 mg/dL (ref 0.44–1.00)
GFR, Estimated: >60 mL/min (ref >60)
Glucose, Bld: 82 mg/dL (ref 70–99)
Potassium: 3.7 mmol/L (ref 3.5–5.1)
Sodium: 134 mmol/L — ABNORMAL LOW (ref 135–145)

## 2020-11-19 LAB — URINALYSIS, ROUTINE W REFLEX MICROSCOPIC
Bilirubin Urine: NEGATIVE
Glucose, UA: NEGATIVE mg/dL
Ketones, ur: NEGATIVE mg/dL
Leukocytes,Ua: NEGATIVE
Nitrite: NEGATIVE
Protein, ur: NEGATIVE mg/dL
Specific Gravity, Urine: 1.015 (ref 1.005–1.030)
pH: 6.5 (ref 5.0–8.0)

## 2020-11-19 LAB — WET PREP, GENITAL
Sperm: NONE SEEN
Trich, Wet Prep: NONE SEEN
Yeast Wet Prep HPF POC: NONE SEEN

## 2020-11-19 LAB — URINALYSIS, MICROSCOPIC (REFLEX)

## 2020-11-19 LAB — PREGNANCY, URINE: Preg Test, Ur: NEGATIVE

## 2020-11-19 MED ORDER — METRONIDAZOLE 500 MG PO TABS: 500.0000 mg | ORAL_TABLET | Freq: Two times a day (BID) | ORAL | 0 refills | Status: AC

## 2020-11-19 NOTE — ED Provider Notes (Signed)
[facility] EMERGENCY DEPARTMENT Provider Note   CSN: [id number] Arrival date & time: 11/19/20  1846     History Chief Complaint  Patient presents with   Vaginal Bleeding    Makayla Lewis is a 25 y.o. female.  HPI  Patient with no significant medical history presents to the emergency department with chief complaint of vaginal bleeding.  Patient states this started approximately 1 month ago, started after her last menstrual cycle, states she has had a consistent bleeding since then, she goes through approximately 4 pads daily, she has associated pelvic cramping this will come and go, no vaginal discharge, no stomach pain, nausea, vomiting, lightheaded or dizziness.  She is not on contraceptives, she is sexually active, does not believe that she is pregnant, has never had this in the past.  She denies feeling lightheaded, dizziness, chest pain, shortness of breath.  Patient does not have an OB/GYN, she has no history of ovarian torsion or ovarian cysts, she denies any alleviating or aggravating factors.  Patient does not endorse fevers, chills, chest pain, abdominal pain, nausea, vomiting diarrhea, pedal edema.  Past Medical History:  Diagnosis Date   Asthma     There are no problems to display for this patient.   Past Surgical History:  Procedure Laterality Date   CHOLECYSTECTOMY       OB History   No obstetric history on file.     No family history on file.  Social History   Tobacco Use   Smoking status: Some Days   Smokeless tobacco: Never  Vaping Use   Vaping Use: Never used  Substance Use Topics   Alcohol use: No   Drug use: No    Home Medications Prior to Admission medications   Medication Sig Start Date End Date Taking? Authorizing Provider  metroNIDAZOLE (FLAGYL) 500 MG tablet Take 1 tablet (500 mg total) by mouth 2 (two) times daily. 11/19/20  Yes Carroll Sage, PA-C  albuterol (PROVENTIL HFA;VENTOLIN HFA) 108 (90 Base) MCG/ACT inhaler  Inhale 2 puffs into the lungs every 6 (six) hours as needed for wheezing or shortness of breath.    [provider]  potassium chloride (KLOR-CON) 10 MEQ tablet Take 1 tablet (10 mEq total) by mouth daily for 5 days. 05/07/19 05/12/19  Tanda Rockers, PA-C    Allergies    Patient has no known allergies.  Review of Systems   Review of Systems  Constitutional:  Negative for chills and fever.  HENT:  Negative for congestion.   Respiratory:  Negative for shortness of breath.   Cardiovascular:  Negative for chest pain.  Gastrointestinal:  Negative for abdominal pain.  Genitourinary:  Positive for pelvic pain and vaginal bleeding. Negative for difficulty urinating, dysuria, enuresis, hematuria, vaginal discharge and vaginal pain.  Musculoskeletal:  Negative for back pain.  Skin:  Negative for rash.  Neurological:  Negative for dizziness.  Hematological:  Does not bruise/bleed easily.   Physical Exam Updated Vital Signs BP (!) 127/97   Pulse 86   Temp 98.4 F (36.9 C) (Oral)   Resp 18   Ht 5\' 5"  (1.651 m)   Wt 98.9 kg   SpO2 100%   BMI 36.28 kg/m   Physical Exam Vitals and nursing note reviewed. Exam conducted with a chaperone present.  Constitutional:      General: She is not in acute distress.    Appearance: She is not ill-appearing.  HENT:     Head: Normocephalic and atraumatic.  Nose: No congestion.  Eyes:     Conjunctiva/sclera: Conjunctivae normal.  Cardiovascular:     Rate and Rhythm: Normal rate and regular rhythm.     Pulses: Normal pulses.     Heart sounds: No murmur heard.   No friction rub. No gallop.  Pulmonary:     Effort: No respiratory distress.     Breath sounds: No wheezing, rhonchi or rales.  Abdominal:     Palpations: Abdomen is soft.     Tenderness: There is abdominal tenderness. There is no right CVA tenderness or left CVA tenderness.     Comments: Abdomen nondistended, normal bowel sounds, dull to percussion, she has pain in her  suprapubic area, no guarding, rebound tenderness, peritoneal sign.  Genitourinary:    Comments: With chaperone present pelvis exam was performed, there is no gross deformities present on her external genitalia, patient's vaginal vault was patent, pink, no deformities present, patient had blood noted coming from the cervix, there is no gross deformities present on the cervix, no discharge present.  Patient had no chandelier sign or adnexal tenderness. Skin:    General: Skin is warm and dry.  Neurological:     Mental Status: She is alert.  Psychiatric:        Mood and Affect: Mood normal.    ED Results / Procedures / Treatments   Labs (all labs ordered are listed, but only abnormal results are displayed) Labs Reviewed  WET PREP, GENITAL - Abnormal; Notable for the following components:      Result Value   Clue Cells Wet Prep HPF POC PRESENT (*)    WBC, Wet Prep HPF POC FEW (*)    All other components within normal limits  URINALYSIS, ROUTINE W REFLEX MICROSCOPIC - Abnormal; Notable for the following components:   Hgb urine dipstick MODERATE (*)    All other components within normal limits  URINALYSIS, MICROSCOPIC (REFLEX) - Abnormal; Notable for the following components:   Bacteria, UA RARE (*)    All other components within normal limits  BASIC METABOLIC PANEL - Abnormal; Notable for the following components:   Sodium 134 (*)    Calcium 8.8 (*)    All other components within normal limits  PREGNANCY, URINE  CBC WITH DIFFERENTIAL/PLATELET  GC/CHLAMYDIA PROBE AMP ([place]) NOT AT [facility]    EKG None  Radiology US PELVIC COMPLETE W TRANSVAGINAL AND TORSION R/O  Result Date: 11/19/2020 CLINICAL DATA:  Pelvic pain EXAM: TRANSABDOMINAL AND TRANSVAGINAL ULTRASOUND OF PELVIS DOPPLER ULTRASOUND OF OVARIES TECHNIQUE: Both transabdominal and transvaginal ultrasound examinations of the pelvis were performed. Transabdominal technique was performed for global imaging of the pelvis  including uterus, ovaries, adnexal regions, and pelvic cul-de-sac. It was necessary to proceed with endovaginal exam following the transabdominal exam to visualize the uterus endometrium ovaries. Color and duplex Doppler ultrasound was utilized to evaluate blood flow to the ovaries. COMPARISON:  CT 11/15/2016 FINDINGS: Uterus Measurements: 8.6 x 4.7 x 5.7 cm = volume: 230.4 mL. No fibroids or other mass visualized. Nabothian cysts in the cervix. Endometrium Thickness: 3.3 mm.  No focal abnormality visualized. Right ovary Measurements: 3.4 x 2.3 x 2.2 cm = volume: 17.2 mL. Normal appearance/no adnexal mass. Left ovary Measurements: 3.9 x 2 x 1.8 cm = volume: 14 mL. Normal appearance/no adnexal mass. Pulsed Doppler evaluation of both ovaries demonstrates normal low-resistance arterial and venous waveforms in the right ovary. Venous waveform present in left ovary. Poorly demonstrated left arterial waveform. Other findings No abnormal free fluid. IMPRESSION:  1. Negative for right ovarian torsion. 2. Assessment of left ovary limited secondary to habitus and deep/posterior position of left ovary, venous flow is present but arterial flow poorly demonstrated, probably due to ovarian position, but torsion not entirely excluded. Left ovary is otherwise normal in size and appearance. Electronically Signed   By: Jasmine Pang M.D.   On: 11/19/2020 21:32    Procedures Procedures   Medications Ordered in ED Medications - No data to display  ED Course  I have reviewed the triage vital signs and the nursing notes.  Pertinent labs & imaging results that were available during my care of the patient were reviewed by me and considered in my medical decision making (see chart for details).    MDM Rules/Calculators/A&P                          Initial impression-patient presents with vaginal bleeding.  She is alert, does not appear acute distress, vital signs reassuring.  Will recommend basic lab work-up, wet prep, GC  chlamydia, UA, urine pregnancy, and the pelvic exam for further evaluation.  Work-up-CBC unremarkable, BMP shows slight hyponatremia 134, slight hypocalcemia 8.8, UA shows rare bacteria, urine pregnancy is negative, wet prep reveals clue cells few white blood cells.  Transvaginal sound negative for torsion of the right ovary, left ovary slightly limited due to body habitus, venous flow is present but arterial flow was poorly demonstrated likely due to ovarian position.  Reassessment-patient was nontender during pelvic exam but she was tender around her suprapubic region during my palpation.  I am concerned for possible ovarian torsion.  Will obtain imaging for further evaluation.  Patient agreement this plan.  Patient is reassessed, continues no complaints this time, vital signs remained stable.  Rule out-I have low suspicion for ectopic pregnancy as urine pregnancy is negative.  Low suspicion for ovarian torsion as presentation is atypical, she does not have adnexal pain, she is not in  pain, imaging is reassuring.  I have low suspicion for UTI, pyelonephritis, kidney stones as UA is negative for signs infection, no hematuria, no CVA tenderness.  Low suspicion for STI as she denies vaginal discharge, there is none seen during my exam.  Will defer antibiotics at this time.  Low suspicion for a spontaneous abortion as urine pregnancy is negative.  Plan-  Vaginal bleeding-possible this may be due to fibroids, endometriosis,  will have her follow-up with OB/GYN for further evaluation.  Gave her strict return precautions. BV- will provide patient with Flagyl, follow-up with OB/GYN for further evaluation.  Vital signs have remained stable, no indication for hospital admission.  Patient given at home care as well strict return precautions.  Patient verbalized that they understood agreed to said plan.  Final Clinical Impression(s) / ED Diagnoses Final diagnoses:  Vaginal bleeding  BV (bacterial  vaginosis)    Rx / DC Orders ED Discharge Orders          Ordered    metroNIDAZOLE (FLAGYL) 500 MG tablet  2 times daily        11/19/20 2215             Carroll Sage, PA-C 11/19/20 2240    Virgina Norfolk, DO 11/19/20 2252

## 2020-11-19 NOTE — Discharge Instructions (Addendum)
Your exam and imaging were reassuring.  It appears that you have bacterial vaginosis, I have started you on antibiotics please take as prescribed.  This antibiotics reacts poorly with alcohol do not consume alcohol taking this medication.  You must follow-up with an OB/GYN, I been contacted for above please call to schedule a appointment.  Come back to the emergency department if you develop chest pain, shortness of breath, severe abdominal pain, uncontrolled nausea, vomiting, diarrhea.

## 2020-11-19 NOTE — ED Triage Notes (Signed)
Vaginal bleeding since her menses in July.

## 2020-11-20 LAB — GC/CHLAMYDIA PROBE AMP (~~LOC~~) NOT AT ARMC
Chlamydia: NEGATIVE
Comment: NEGATIVE
Comment: NORMAL
Neisseria Gonorrhea: NEGATIVE

## 2021-10-18 ENCOUNTER — Encounter (HOSPITAL_BASED_OUTPATIENT_CLINIC_OR_DEPARTMENT_OTHER): Payer: Self-pay | Admitting: Emergency Medicine

## 2021-10-18 ENCOUNTER — Other Ambulatory Visit: Payer: Self-pay

## 2021-10-18 ENCOUNTER — Emergency Department (HOSPITAL_BASED_OUTPATIENT_CLINIC_OR_DEPARTMENT_OTHER): Payer: Self-pay

## 2021-10-18 ENCOUNTER — Emergency Department (HOSPITAL_BASED_OUTPATIENT_CLINIC_OR_DEPARTMENT_OTHER)
Admission: EM | Admit: 2021-10-18 | Discharge: 2021-10-19 | Disposition: A | Discharge status: Home / Self Care | Payer: Self-pay | Attending: Emergency Medicine | Admitting: Emergency Medicine

## 2021-10-18 DIAGNOSIS — R200 Anesthesia of skin: Secondary | ICD-10-CM | POA: Insufficient documentation

## 2021-10-18 DIAGNOSIS — M25522 Pain in left elbow: Secondary | ICD-10-CM | POA: Insufficient documentation

## 2021-10-18 DIAGNOSIS — Y92410 Unspecified street and highway as the place of occurrence of the external cause: Secondary | ICD-10-CM | POA: Insufficient documentation
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
## 2023-01-29 IMAGING — US US PELVIS COMPLETE TRANSABD/TRANSVAG W DUPLEX
1 series · 13 of 25 positions shown · non-contrast
Comparison: CT 11/15/2016

CLINICAL DATA: Pelvic pain

EXAM:
TRANSABDOMINAL AND TRANSVAGINAL ULTRASOUND OF PELVIS
DOPPLER ULTRASOUND OF OVARIES
TECHNIQUE: Both transabdominal and transvaginal ultrasound examinations of the
pelvis were performed. Transabdominal technique was performed for
global imaging of the pelvis including uterus, ovaries, adnexal
regions, and pelvic cul-de-sac.
It was necessary to proceed with endovaginal exam following the
transabdominal exam to visualize the uterus endometrium ovaries.
Color and duplex Doppler ultrasound was utilized to evaluate blood
flow to the ovaries.

[Series 1: us pelvis complete transabd/transvag w duplex · 13 of 57 slices shown]
[im 1/57]
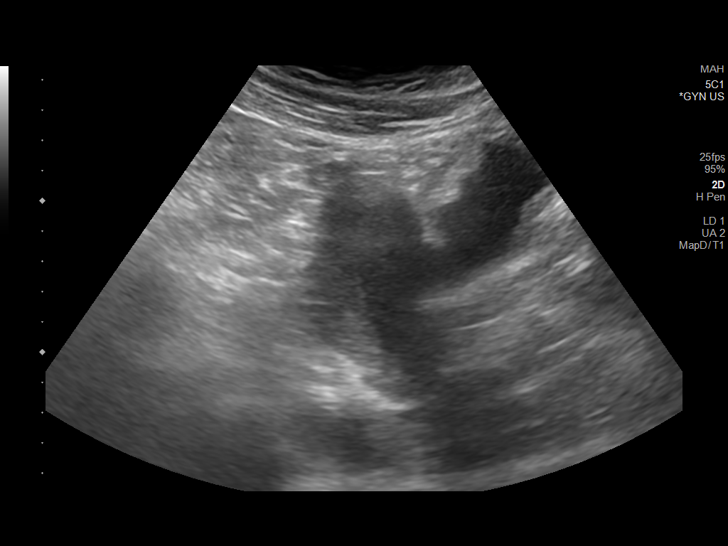
[im 5/57]
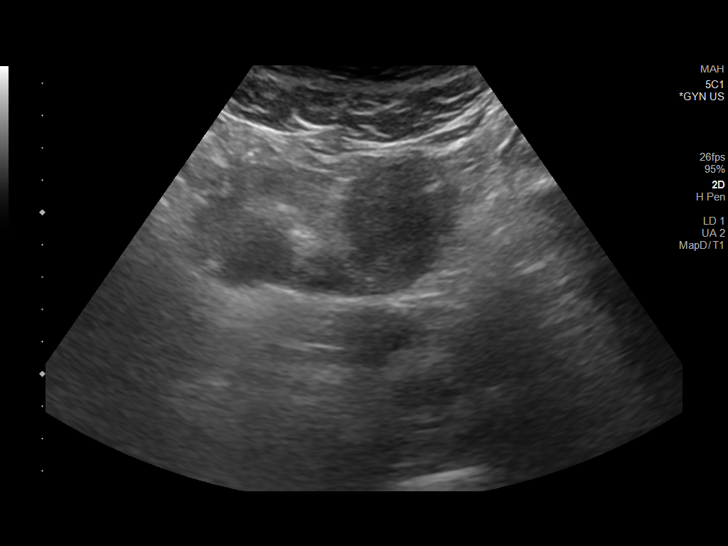
[im 10/57]
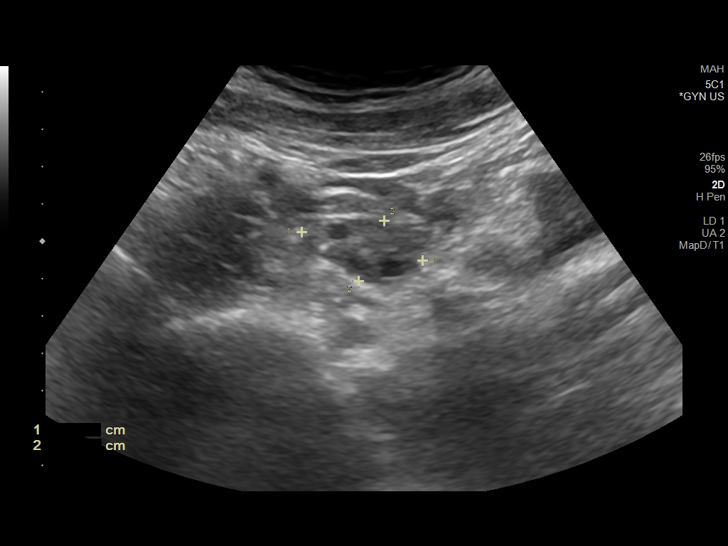
[im 15/57]
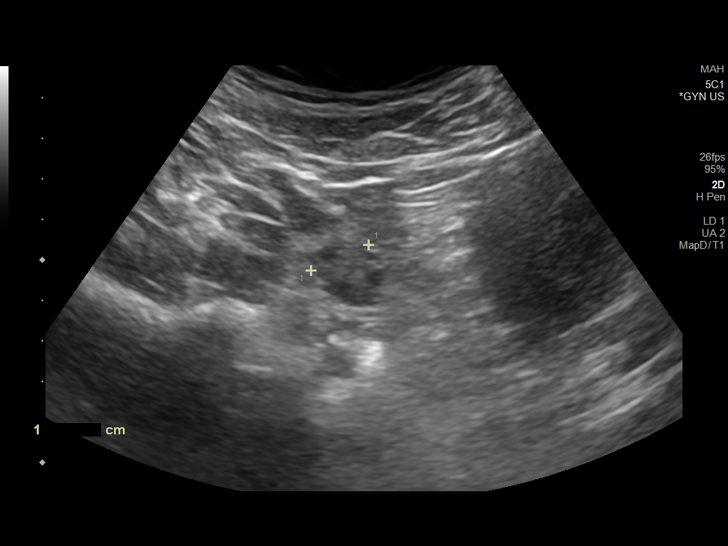
[im 19/57]
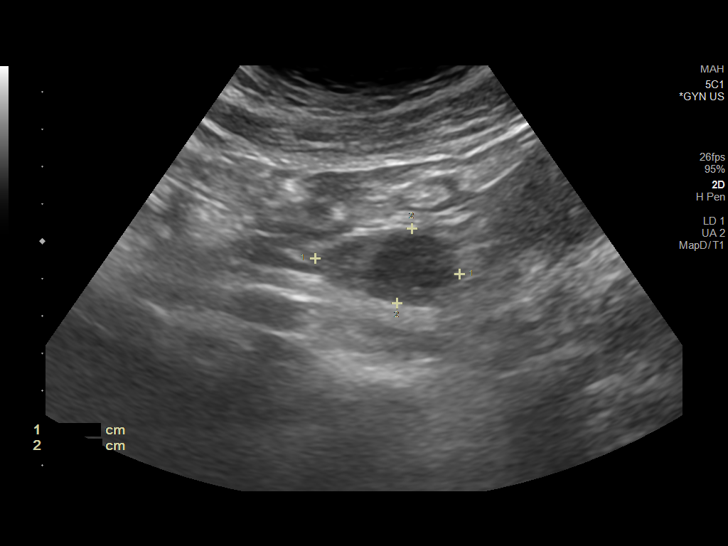
[im 24/57]
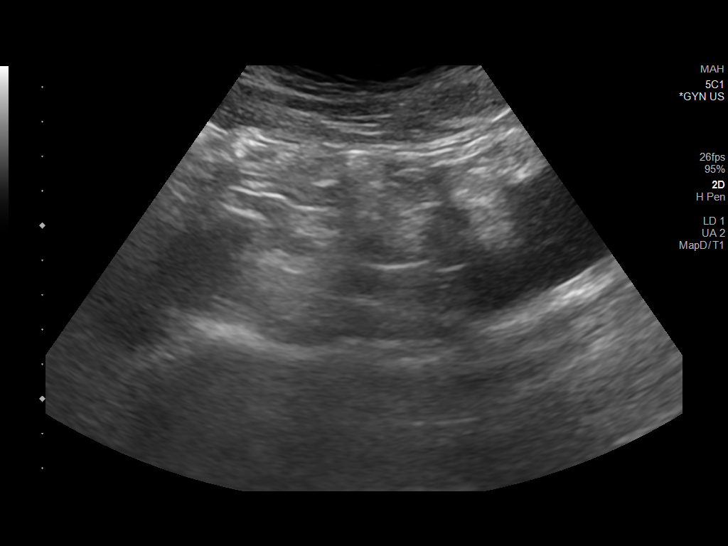
[im 29/57]
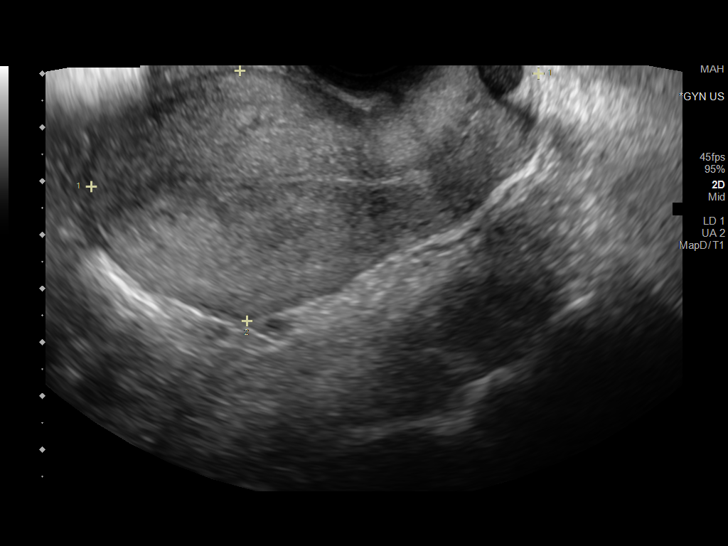
[im 33/57]
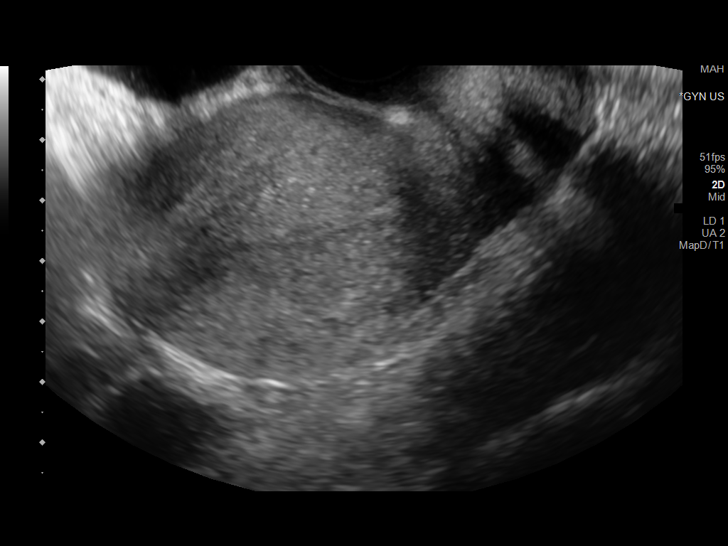
[im 38/57]
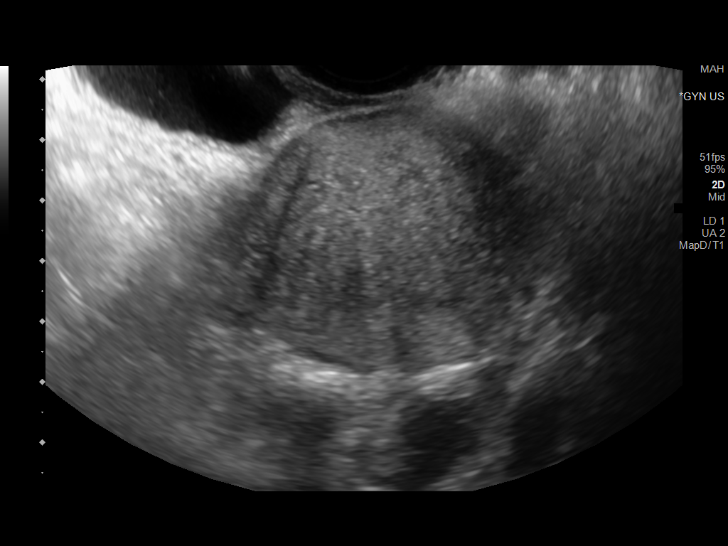
[im 43/57]
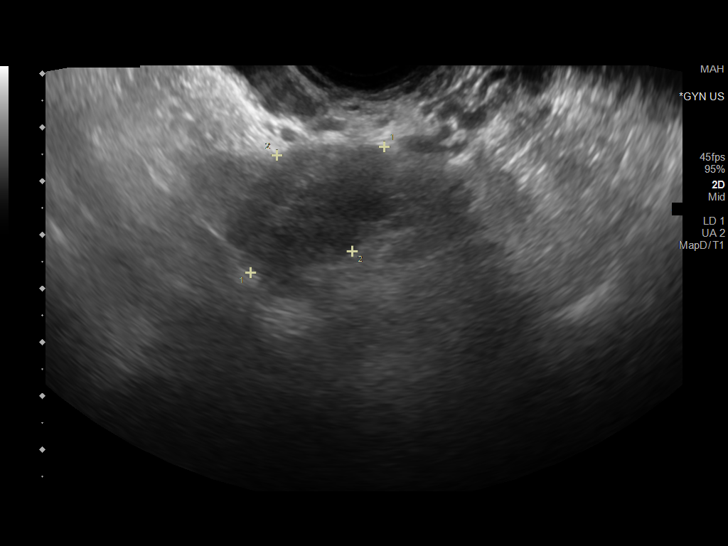
[im 47/57]
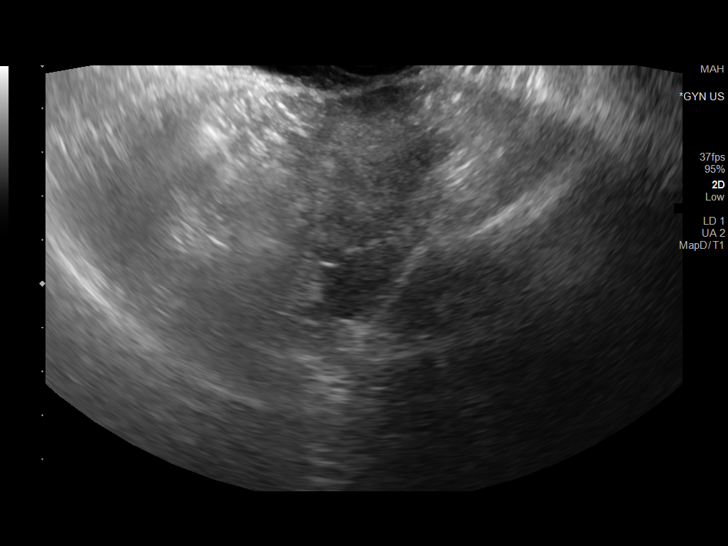
[im 52/57]
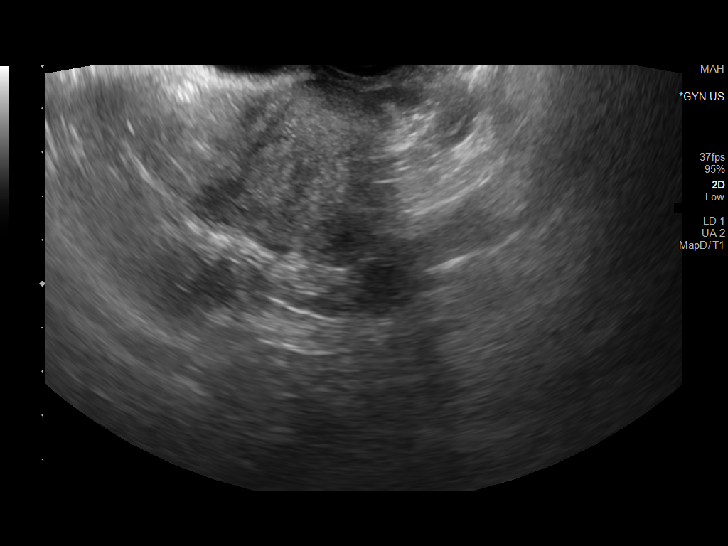
[im 57/57]
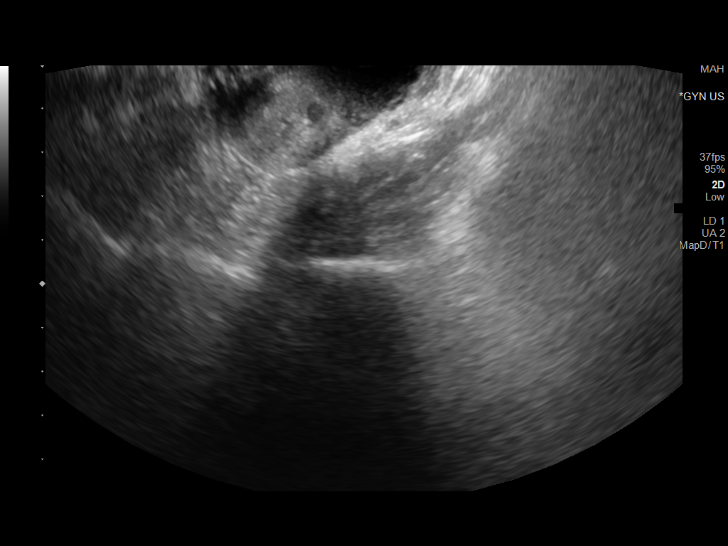

[13 of 25 positions shown; findings below may reference images not displayed]

FINDINGS: Uterus

Measurements: 8.6 x 4.7 x 5.7 cm = volume: 230.4 mL. No fibroids or
other mass visualized. Nabothian cysts in the cervix.

Endometrium

Thickness: 3.3 mm.  No focal abnormality visualized.

Right ovary

Measurements: 3.4 x 2.3 x 2.2 cm = volume: 17.2 mL. Normal
appearance/no adnexal mass.

Left ovary

Measurements: 3.9 x 2 x 1.8 cm = volume: 14 mL. Normal appearance/no
adnexal mass.

Pulsed Doppler evaluation of both ovaries demonstrates normal
low-resistance arterial and venous waveforms in the right ovary.
Venous waveform present in left ovary. Poorly demonstrated left
arterial waveform.

Other findings

No abnormal free fluid.
IMPRESSION: 1. Negative for right ovarian torsion.
2. Assessment of left ovary limited secondary to habitus and
deep/posterior position of left ovary, venous flow is present but
arterial flow poorly demonstrated, probably due to ovarian position,
but torsion not entirely excluded. Left ovary is otherwise normal in
size and appearance.

## 2023-11-13 ENCOUNTER — Other Ambulatory Visit: Payer: Self-pay

## 2023-11-13 ENCOUNTER — Emergency Department (HOSPITAL_BASED_OUTPATIENT_CLINIC_OR_DEPARTMENT_OTHER)
Admission: EM | Admit: 2023-11-13 | Discharge: 2023-11-14 | Disposition: A | Discharge status: Home / Self Care | Attending: Emergency Medicine | Admitting: Emergency Medicine

## 2023-11-13 DIAGNOSIS — K0889 Other specified disorders of teeth and supporting structures: Secondary | ICD-10-CM | POA: Diagnosis present

## 2023-11-14 ENCOUNTER — Encounter (HOSPITAL_BASED_OUTPATIENT_CLINIC_OR_DEPARTMENT_OTHER): Payer: Self-pay

## 2023-11-14 ENCOUNTER — Other Ambulatory Visit: Payer: Self-pay

## 2023-11-14 DIAGNOSIS — K0889 Other specified disorders of teeth and supporting structures: Secondary | ICD-10-CM | POA: Diagnosis not present

## 2023-11-14 MED ORDER — KETOROLAC TROMETHAMINE 15 MG/ML IJ SOLN
30.0000 mg | Freq: Once | INTRAMUSCULAR | Status: AC
  Administered 2023-11-14 (×2): 30 mg via INTRAMUSCULAR
  Filled 2023-11-14: qty 2

## 2023-11-14 MED ORDER — CLINDAMYCIN HCL 300 MG PO CAPS: 300.0000 mg | ORAL_CAPSULE | Freq: Three times a day (TID) | ORAL | 0 refills | Status: AC

## 2023-11-14 MED ORDER — OXYCODONE HCL 5 MG PO TABS
5.0000 mg | ORAL_TABLET | Freq: Once | ORAL | Status: AC
  Administered 2023-11-14 (×2): 5 mg via ORAL
  Filled 2023-11-14: qty 1

## 2023-11-14 MED ORDER — CELECOXIB 200 MG PO CAPS: 200.0000 mg | ORAL_CAPSULE | Freq: Two times a day (BID) | ORAL | 0 refills | Status: AC

## 2023-11-14 MED ORDER — CLINDAMYCIN HCL 150 MG PO CAPS
300.0000 mg | ORAL_CAPSULE | Freq: Once | ORAL | Status: AC
  Administered 2023-11-14 (×2): 300 mg via ORAL
  Filled 2023-11-14: qty 2

## 2023-11-14 NOTE — ED Triage Notes (Signed)
 Right sided dental pain radiating to her head. Pain manifested ~1 week ago.

## 2023-11-14 NOTE — ED Provider Notes (Signed)
 [facility] AT [facility] Provider Note   CSN: [id number] Arrival date & time: 11/13/23  2354     History Chief Complaint  Patient presents with   Dental Pain    HPI Makayla Lewis is a 28 y.o. female presenting for chief complaint of teeth pain. Radiates from right side to back of head. Comes from her back wisdom teeth  Patient's recorded medical, surgical, social, medication list and allergies were reviewed in the Snapshot window as part of the initial history.   Review of Systems   Review of Systems  Constitutional:  Negative for chills and fever.  HENT:  Positive for dental problem. Negative for ear pain and sore throat.   Eyes:  Negative for pain and visual disturbance.  Respiratory:  Negative for cough and shortness of breath.   Cardiovascular:  Negative for chest pain and palpitations.  Gastrointestinal:  Negative for abdominal pain and vomiting.  Genitourinary:  Negative for dysuria and hematuria.  Musculoskeletal:  Negative for arthralgias and back pain.  Skin:  Negative for color change and rash.  Neurological:  Negative for seizures and syncope.  All other systems reviewed and are negative.   Physical Exam Updated Vital Signs BP 129/82 (BP Location: Right Arm)   Pulse 94   Temp 97.9 F (36.6 C) (Axillary)   Resp 20   Ht 5' 5 (1.651 m)   Wt 97.5 kg   SpO2 100%   BMI 35.78 kg/m  Physical Exam Vitals and nursing note reviewed.  Constitutional:      General: She is not in acute distress.    Appearance: She is well-developed.  HENT:     Head: Normocephalic and atraumatic.  Eyes:     Conjunctiva/sclera: Conjunctivae normal.  Cardiovascular:     Rate and Rhythm: Normal rate and regular rhythm.     Heart sounds: No murmur heard. Pulmonary:     Effort: Pulmonary effort is normal. No respiratory distress.     Breath sounds: Normal breath sounds.  Abdominal:     General: There is no distension.     Palpations: Abdomen is  soft.     Tenderness: There is no abdominal tenderness. There is no right CVA tenderness or left CVA tenderness.  Musculoskeletal:        General: No swelling or tenderness. Normal range of motion.     Cervical back: Neck supple.  Skin:    General: Skin is warm and dry.  Neurological:     General: No focal deficit present.     Mental Status: She is alert and oriented to person, place, and time. Mental status is at baseline.     Cranial Nerves: No cranial nerve deficit.      ED Course/ Medical Decision Making/ A&P    Procedures Procedures   Medications Ordered in ED Medications  oxyCODONE  (Oxy IR/ROXICODONE ) immediate release tablet 5 mg (5 mg Oral Given 11/14/23 0048)  ketorolac  (TORADOL ) 15 MG/ML injection 30 mg (30 mg Intramuscular Given 11/14/23 0049)  clindamycin  (CLEOCIN ) capsule 300 mg (300 mg Oral Given 11/14/23 0048)   Medical Decision Making:   Makayla Lewis is a 28 y.o. female who presented to the ED today with dental pain detailed above.    Complete initial physical exam performed, notably the patient was HDS in no acute distress. No obvious intraoral lesions or swelling. Poor dentition diffusely    Reviewed and confirmed nursing documentation for past medical history, family history, social history.    Initial  Assessment:   With the patient's presentation of dental pain, most likely diagnosis is reversible vs irreversible pulpitis. Other diagnoses were considered including (but not limited to) ludwig's angina, osteitis, dental abscess, PTA, RPA. These are considered less likely due to history of present illness and physical exam findings.   This is most consistent with an acute complicated illness  Initial Plan:  Symptomatic management with NSAIDS/Tylenol  Due to clinical overlap with potentially reversible pulpitis, antibiotics prescribed Patient will need definitive management with dentistry. Provided low cost/local dental resource chart provided by social  work.  Disposition:  I have considered need for hospitalization, however, considering all of the above, I believe this patient is stable for discharge at this time.  Patient/family educated about specific return precautions for given chief complaint and symptoms.  Patient/family educated about follow-up with PCP and dentistry.    Patient/family expressed understanding of return precautions and need for follow-up. Patient spoken to regarding all imaging and laboratory results and appropriate follow up for these results. All education provided in verbal form with additional information in written form. Time was allowed for answering of patient questions. Patient discharged.      Clinical Impression:  1. Pain, dental      Discharge   Final Clinical Impression(s) / ED Diagnoses Final diagnoses:  Pain, dental    Rx / DC Orders ED Discharge Orders          Ordered    clindamycin  (CLEOCIN ) 300 MG capsule  3 times daily        11/14/23 0114    celecoxib  (CELEBREX ) 200 MG capsule  2 times daily        11/14/23 0114              Jerral Meth, MD 11/14/23 [phone]
